# Patient Record
Sex: Male | Born: 1995 | Race: Black or African American | Hispanic: No | Marital: Single
Health system: Southern US, Community
[De-identification: ages and names within clinical notes are randomized; demographics above are authoritative.]

---
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
## 2020-03-25 ENCOUNTER — Other Ambulatory Visit: Payer: Self-pay

## 2020-03-25 ENCOUNTER — Emergency Department (HOSPITAL_COMMUNITY)
Admission: EM | Admit: 2020-03-25 | Discharge: 2020-03-25 | Discharge status: Against Medical Advice | Payer: Medicaid Other

## 2020-03-25 NOTE — ED Notes (Signed)
Not in lobby called on loud speaker unable to find

## 2024-02-01 ENCOUNTER — Ambulatory Visit: Payer: Medicaid Other | Admitting: Family Medicine
